# Patient Record
Sex: Female | Born: 2005
Health system: Southern US, Community
[De-identification: ages and names within clinical notes are randomized; demographics above are authoritative.]

## PROBLEM LIST (undated history)

## (undated) ENCOUNTER — Emergency Department (HOSPITAL_COMMUNITY): Admission: EM | Payer: 59 | Source: Home / Self Care | Admitting: Otolaryngology

## (undated) DIAGNOSIS — Z8489 Family history of other specified conditions: Secondary | ICD-10-CM

## (undated) DIAGNOSIS — D560 Alpha thalassemia: Secondary | ICD-10-CM

## (undated) DIAGNOSIS — K5900 Constipation, unspecified: Secondary | ICD-10-CM

## (undated) DIAGNOSIS — S52501A Unspecified fracture of the lower end of right radius, initial encounter for closed fracture: Secondary | ICD-10-CM

## (undated) DIAGNOSIS — Z789 Other specified health status: Secondary | ICD-10-CM

## (undated) DIAGNOSIS — R6252 Short stature (child): Secondary | ICD-10-CM

## (undated) DIAGNOSIS — F410 Panic disorder [episodic paroxysmal anxiety] without agoraphobia: Secondary | ICD-10-CM

## (undated) DIAGNOSIS — R630 Anorexia: Secondary | ICD-10-CM

## (undated) DIAGNOSIS — Z8679 Personal history of other diseases of the circulatory system: Secondary | ICD-10-CM

## (undated) DIAGNOSIS — R011 Cardiac murmur, unspecified: Secondary | ICD-10-CM

## (undated) DIAGNOSIS — R040 Epistaxis: Secondary | ICD-10-CM

## (undated) DIAGNOSIS — F82 Specific developmental disorder of motor function: Secondary | ICD-10-CM

## (undated) DIAGNOSIS — R209 Unspecified disturbances of skin sensation: Secondary | ICD-10-CM

## (undated) DIAGNOSIS — K0889 Other specified disorders of teeth and supporting structures: Secondary | ICD-10-CM

## (undated) DIAGNOSIS — Z0282 Encounter for adoption services: Secondary | ICD-10-CM

## (undated) HISTORY — DX: Other specified health status: Z78.9

## (undated) HISTORY — DX: Alpha thalassemia: D56.0

## (undated) HISTORY — DX: Unspecified fracture of the lower end of right radius, initial encounter for closed fracture: S52.501A

## (undated) HISTORY — DX: Encounter for adoption services: Z02.82

---
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
## 2024-03-30 ENCOUNTER — Other Ambulatory Visit: Payer: Self-pay

## 2024-03-31 ENCOUNTER — Other Ambulatory Visit: Payer: Self-pay

## 2024-04-15 ENCOUNTER — Telehealth

## 2024-05-10 ENCOUNTER — Other Ambulatory Visit: Payer: Self-pay | Admitting: Family

## 2024-05-10 ENCOUNTER — Other Ambulatory Visit: Payer: Self-pay

## 2024-05-10 DIAGNOSIS — M859 Disorder of bone density and structure, unspecified: Secondary | ICD-10-CM

## 2024-05-10 DIAGNOSIS — Z30011 Encounter for initial prescription of contraceptive pills: Secondary | ICD-10-CM

## 2024-05-10 MED FILL — Norethindrone Ace & Ethinyl Estradiol Tab 1.5 MG-30 MCG: ORAL | 84 days supply | Qty: 84 | Fill #0 | Status: AC

## 2024-05-17 ENCOUNTER — Encounter: Payer: Self-pay | Admitting: Family Medicine

## 2024-05-17 ENCOUNTER — Ambulatory Visit: Admitting: Family Medicine

## 2024-05-17 VITALS — BP 116/70 | HR 73 | Temp 98.8°F | Ht 58.5 in | Wt 106.5 lb

## 2024-05-17 DIAGNOSIS — F902 Attention-deficit hyperactivity disorder, combined type: Secondary | ICD-10-CM | POA: Diagnosis not present

## 2024-05-17 DIAGNOSIS — M4120 Other idiopathic scoliosis, site unspecified: Secondary | ICD-10-CM

## 2024-05-17 DIAGNOSIS — E559 Vitamin D deficiency, unspecified: Secondary | ICD-10-CM | POA: Diagnosis not present

## 2024-05-17 DIAGNOSIS — Z8659 Personal history of other mental and behavioral disorders: Secondary | ICD-10-CM | POA: Diagnosis not present

## 2024-05-17 DIAGNOSIS — N921 Excessive and frequent menstruation with irregular cycle: Secondary | ICD-10-CM | POA: Diagnosis not present

## 2024-05-17 DIAGNOSIS — D560 Alpha thalassemia: Secondary | ICD-10-CM

## 2024-05-17 DIAGNOSIS — F840 Autistic disorder: Secondary | ICD-10-CM | POA: Diagnosis not present

## 2024-05-17 DIAGNOSIS — J302 Other seasonal allergic rhinitis: Secondary | ICD-10-CM

## 2024-05-17 DIAGNOSIS — F411 Generalized anxiety disorder: Secondary | ICD-10-CM

## 2024-05-17 DIAGNOSIS — F88 Other disorders of psychological development: Secondary | ICD-10-CM | POA: Diagnosis not present

## 2024-05-17 DIAGNOSIS — R6252 Short stature (child): Secondary | ICD-10-CM

## 2024-05-17 NOTE — Assessment & Plan Note (Signed)
 Prevention on depo provera   Now OC 1.5/30-taken continuously  Doing well with it  No periods

## 2024-05-17 NOTE — Progress Notes (Signed)
 "  Subjective:    Patient ID: Mckenzie Barajas, female    DOB: [birth year], 18 y.o.   MRN: [id number]  HPI  Wt Readings from Last 3 Encounters:  05/17/24 106 lb 8 oz (48.3 kg) (10%, Z= -1.26)*  08/05/23 102 lb 6.4 oz (46.4 kg) (7%, Z= -1.51)*  05/01/23 101 lb (45.8 kg) (5%, Z= -1.60)*   * Growth percentiles are based on CDC (Girls, 2-20 Years) data.   21.88 kg/m (53%, Z= 0.08, Source: CDC (Girls, 2-20 Years))  Vitals:   05/17/24 1555  BP: 116/70  Pulse: 73  Temp: 98.8 F (37.1 C)  SpO2: 98%   Goes to school at [employer] Wants to go into speech path Just finished her exams [employer]   Is adopted at 3 months from [place]  Parents divorced (but on very good terms)   Lives with 83 yo brother  Has 2 younger brothers in other household    64 yo pt presents to establish for primary care  [facility]   History of autism spectrum disorder   Also ADHD and adjustment disorder with anx/dep mood   History of mild asthma and seasonal allergic rhinitis Exacerbated when sick    Had some physical dev delay with motor early on- did PT/OT  Still has some fine motor issues (buttons, tie shoes)  Does not stop her  Also short stature  Also scoliosis  (does not bother her)   Left eye and right hand dominant Has to practice driving a lot    Does not have to see endocrinologist any more (once she started eating more)     History of low bone density for age  (formerly had depo provera  injections)  Also vitamin D  def  Alpha thalassemia-saw heme at [facility] for that  On oral contraception     Takes montelukast  10 mg daily  Flonase  ns Albuterol  mdi    OC [place]  1.5/30 -takes continuously and does not get period    Imms - utd    History of eating disorder-body dysmorphic disorder  Saw counselor /nutritionist    Not seeing a therapist now  Some stressors        05/17/2024    4:01 PM 11/12/2021    3:37 PM 09/05/2021    4:11 PM 08/14/2021     4:56 PM 06/27/2021   10:50 AM  Depression screen PHQ 2/9  Decreased Interest 0   0   Down, Depressed, Hopeless 0   0   PHQ - 2 Score 0   0   Altered sleeping 0      Tired, decreased energy 0      Change in appetite 0      Feeling bad or failure about yourself  0      Trouble concentrating 0      Moving slowly or fidgety/restless 0      Suicidal thoughts 0      PHQ-9 Score 0      Difficult doing work/chores Not difficult at all         Information is confidential and restricted. Go to Review Flowsheets to unlock data.      05/17/2024    4:01 PM 06/19/2021    4:57 PM 02/19/2021    3:10 PM  GAD 7 : Generalized Anxiety Score  Nervous, Anxious, on Edge 1 1 1   Control/stop worrying 1 1 1   Worry too much - different things 1 1 2   Trouble relaxing 0  0 0  Restless 2 3 3   Easily annoyed or irritable 1 2 3   Afraid - awful might happen 1 1 1   Total GAD 7 Score 7 9 11    Took medication for mental health in past Prozac  caused irritability  Another medicine caused tearfulness  Dx of ADHD Does not feel like she needs medicine    Lab Results  Component Value Date   WBC 7.2 02/20/2021   HGB 9.2 (A) 01/22/2022   HCT 32.8 (L) 02/20/2021   MCV 64 (L) 02/20/2021   PLT 339 05/10/2020      Patient Active Problem List   Diagnosis Date Noted   Generalized anxiety disorder 06/27/2021   Autism spectrum disorder 06/27/2021   Vitamin D  deficiency 03/08/2021   History of eating disorder 02/26/2021   Menorrhagia with irregular cycle 05/10/2020   Global developmental delay 06/01/2018   Delayed puberty 04/27/2018   Idiopathic scoliosis and kyphoscoliosis 04/27/2018   History of fracture 04/16/2017   Seasonal allergic rhinitis 09/25/2015   Attention deficit hyperactivity disorder (ADHD), combined type 02/25/2014   Short stature for age 22/06/2013   Delayed bone age 16/03/2014   Alpha thalassemia    Past Medical History:  Diagnosis Date   Adopted    Alpha thalassemia    mother states  no current treatment   Constipation    occasional   Distal radius fracture, right    Epistaxis 07/2015   Family history of adverse reaction to anesthesia    unknown---adopted   Fine motor development delay    Gross motor development delay    Heart murmur    as an infant   History of cardiac murmur    as an infant   Panic attacks    Poor appetite    Sensory disorder    Short stature disorder    Tooth loose 08/21/2015   Past Surgical History:  Procedure Laterality Date   NASAL ENDOSCOPY N/A 09/07/2017   Procedure: NASAL ENDOSCOPY EPISTAXIS;  Surgeon: Mable Lenis, MD;  [facility];  Service: ENT;  Laterality: N/A;   NASAL ENDOSCOPY WITH EPISTAXIS CONTROL Bilateral 09/04/2015   Procedure: ENDOSCOPY NASAL CAUTERY;  Surgeon: Lenis Mable, MD;  Location: [facility];  Service: ENT;  Laterality: Bilateral;   NASAL ENDOSCOPY WITH EPISTAXIS CONTROL N/A 08/20/2017   Procedure: NASAL ENDOSCOPY WITH EPISTAXIS CONTROL/NASAL CAUTERY;  Surgeon: Mable Lenis, MD;  Location: [facility];  Service: ENT;  Laterality: N/A;   Social History[1] Family History  Adopted: Yes  Family history unknown: Yes   Allergies[2] Medications Ordered Prior to Encounter[3]  Review of Systems  Constitutional:  Negative for activity change, appetite change, fatigue, fever and unexpected weight change.  HENT:  Negative for congestion, ear pain, rhinorrhea, sinus pressure and sore throat.   Eyes:  Negative for pain, redness and visual disturbance.  Respiratory:  Negative for cough, shortness of breath and wheezing.   Cardiovascular:  Negative for chest pain and palpitations.  Gastrointestinal:  Negative for abdominal pain, blood in stool, constipation and diarrhea.  Endocrine: Negative for polydipsia and polyuria.  Genitourinary:  Negative for dysuria, frequency and urgency.  Musculoskeletal:  Negative for arthralgias, back pain and myalgias.  Skin:  Negative for pallor and  rash.  Allergic/Immunologic: Negative for environmental allergies.  Neurological:  Negative for dizziness, syncope and headaches.       Some motor problems/fine motor   Hematological:  Negative for adenopathy. Does not bruise/bleed easily.  Psychiatric/Behavioral:  Negative for decreased concentration,  dysphoric mood and sleep disturbance. The patient is nervous/anxious.        Objective:   Physical Exam Constitutional:      General: She is not in acute distress.    Appearance: Normal appearance. She is well-developed and normal weight. She is not ill-appearing or diaphoretic.  HENT:     Head: Normocephalic and atraumatic.  Eyes:     Conjunctiva/sclera: Conjunctivae normal.     Pupils: Pupils are equal, round, and reactive to light.  Neck:     Thyroid: No thyromegaly.     Vascular: No carotid bruit or JVD.  Cardiovascular:     Rate and Rhythm: Normal rate and regular rhythm.     Heart sounds: Normal heart sounds.     No gallop.  Pulmonary:     Effort: Pulmonary effort is normal. No respiratory distress.     Breath sounds: Normal breath sounds. No wheezing or rales.  Abdominal:     General: There is no distension or abdominal bruit.     Palpations: Abdomen is soft.  Musculoskeletal:     Cervical back: Normal range of motion and neck supple.     Right lower leg: No edema.     Left lower leg: No edema.     Comments: Moderate thoracolumbar scoliosis  Normal rom of spine   Lymphadenopathy:     Cervical: No cervical adenopathy.  Skin:    General: Skin is warm and dry.     Coloration: Skin is not pale.     Findings: No rash.  Neurological:     Mental Status: She is alert.     Motor: No weakness.     Coordination: Coordination normal.     Gait: Gait normal.     Deep Tendon Reflexes: Reflexes are normal and symmetric. Reflexes normal.  Psychiatric:        Attention and Perception: Attention normal.        Mood and Affect: Mood normal.        Behavior: Behavior normal.      Comments: Pleasant             Assessment & Plan:   Problem List Items Addressed This Visit       Respiratory   Seasonal allergic rhinitis   Uses flonase  and singulair  in season         Musculoskeletal and Integument   Idiopathic scoliosis and kyphoscoliosis   Pt tolerates well  Wore a brace early on  Suspect done growing          Other   Vitamin D  deficiency   In setting of past low bone mass  Encouraged to start back on daily vit D Nutrition is better now       Short stature for age   Height is now stable       Menorrhagia with irregular cycle - Primary   Prevention on depo provera   Now OC 1.5/30-taken continuously  Doing well with it  No periods       History of eating disorder   Body dysmorphia in past  This seems to be better  Pt sounds satisfied with her weight  Eating well/normally       Global developmental delay   In past  Still some issues with fine motor function  Doing well overall and in community college       Generalized anxiety disorder   Has had counseling in past  Did not tolerate fluoxetine  and one other medicine  Thinks she is functioning well now  PHQ 0 GAD7  7   Will continue to monitor  Also dx with autism spectrum disorder in past       Autism spectrum disorder   Dx in the past  Doing well overall and in college       Attention deficit hyperactivity disorder (ADHD), combined type   Pt is doing well without medication  Encouraged to keep working on [employer] thalassemia   Baseline anemia  Saw hematology in past No symptoms          [1]  Social History Tobacco Use   Smoking status: Never   Smokeless tobacco: Never  Vaping Use   Vaping status: Never Used  Substance Use Topics   Alcohol use: No   Drug use: No  [2]  Allergies Allergen Reactions   Milk-Related Compounds   [3]  Current Outpatient Medications on File Prior to Visit  Medication Sig Dispense Refill    albuterol  (VENTOLIN  HFA) 108 (90 Base) MCG/ACT inhaler Inhale 2 puffs into the lungs every 6 (six) hours as needed for wheezing or shortness of breath. 6.7 g 0   fluticasone  (FLONASE ) 50 MCG/ACT nasal spray Place 1 spray into both nostrils daily. 16 g 6   montelukast  (SINGULAIR ) 10 MG tablet Take 1 tablet (10 mg total) by mouth at bedtime. 30 tablet 0   Norethindrone  Acetate-Ethinyl Estradiol  (LARIN  1.5/30) 1.5-30 MG-MCG tablet Take 1 tablet by mouth daily. 84 tablet 3   No current facility-administered medications on file prior to visit.   "

## 2024-05-17 NOTE — Assessment & Plan Note (Signed)
 Uses flonase  and singulair  in season

## 2024-05-17 NOTE — Assessment & Plan Note (Signed)
 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

## 2024-05-17 NOTE — Assessment & Plan Note (Signed)
 Body dysmorphia in past  This seems to be better  Pt sounds satisfied with her weight  Eating well/normally

## 2024-05-17 NOTE — Assessment & Plan Note (Signed)
 Baseline anemia  Saw hematology in past No symptoms

## 2024-05-17 NOTE — Patient Instructions (Addendum)
 Get back on track with vitamin D   I would recommend 2000 international units daily    Add some strength training to your routine, this is important for bone and brain health and can reduce your risk of falls and help your body use insulin  properly and regulate weight  Light weights, exercise bands , and internet videos are a good way to start  Yoga (chair or regular), machines , floor exercises or a gym with machines are also good options    Keep eating a balance diet  If you start to get concerned with weight again let us  know If you struggle with attentiveness or mood let us  know

## 2024-05-17 NOTE — Assessment & Plan Note (Signed)
 Height is now stable

## 2024-05-17 NOTE — Assessment & Plan Note (Signed)
 Has had counseling in past  Did not tolerate fluoxetine  and one other medicine  Thinks she is functioning well now  PHQ 0 GAD7  7   Will continue to monitor  Also dx with autism spectrum disorder in past

## 2024-05-17 NOTE — Assessment & Plan Note (Signed)
 In past  Still some issues with fine motor function  Doing well overall and in community college

## 2024-05-17 NOTE — Assessment & Plan Note (Signed)
 In setting of past low bone mass  Encouraged to start back on daily vit D Nutrition is better now

## 2024-05-17 NOTE — Assessment & Plan Note (Signed)
 Dx in the past  Doing well overall and in college

## 2024-05-17 NOTE — Assessment & Plan Note (Signed)
 Pt tolerates well  Wore a brace early on  Suspect done growing

## 2024-06-12 ENCOUNTER — Other Ambulatory Visit: Payer: Self-pay

## 2024-06-12 ENCOUNTER — Encounter: Payer: Self-pay | Admitting: Family Medicine

## 2024-06-12 ENCOUNTER — Other Ambulatory Visit: Payer: Self-pay | Admitting: Family Medicine

## 2024-06-12 ENCOUNTER — Telehealth

## 2024-06-12 DIAGNOSIS — J0140 Acute pansinusitis, unspecified: Secondary | ICD-10-CM

## 2024-06-14 MED ORDER — FLUTICASONE PROPIONATE 50 MCG/ACT NA SUSP
2.0000 | Freq: Every day | NASAL | 3 refills | Status: AC
  Filled 2024-06-14: qty 48, 90d supply, fill #0

## 2024-06-15 ENCOUNTER — Other Ambulatory Visit: Payer: Self-pay

## 2024-06-17 ENCOUNTER — Other Ambulatory Visit: Payer: Self-pay
